# Patient Record
Sex: Female | Born: 1940 | Race: White | Hispanic: No | Smoking: Current every day smoker
Health system: Southern US, Community
[De-identification: ages and names within clinical notes are randomized; demographics above are authoritative.]

## PROBLEM LIST (undated history)

## (undated) DIAGNOSIS — F419 Anxiety disorder, unspecified: Secondary | ICD-10-CM

## (undated) DIAGNOSIS — F32A Depression, unspecified: Secondary | ICD-10-CM

## (undated) DIAGNOSIS — E119 Type 2 diabetes mellitus without complications: Secondary | ICD-10-CM

## (undated) DIAGNOSIS — K635 Polyp of colon: Secondary | ICD-10-CM

## (undated) DIAGNOSIS — F329 Major depressive disorder, single episode, unspecified: Secondary | ICD-10-CM

## (undated) DIAGNOSIS — L900 Lichen sclerosus et atrophicus: Secondary | ICD-10-CM

## (undated) HISTORY — DX: Type 2 diabetes mellitus without complications: E11.9

## (undated) HISTORY — DX: Lichen sclerosus et atrophicus: L90.0

## (undated) HISTORY — DX: Polyp of colon: K63.5

## (undated) HISTORY — DX: Anxiety disorder, unspecified: F41.9

## (undated) HISTORY — DX: Depression, unspecified: F32.A

---
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
## 1998-07-15 ENCOUNTER — Other Ambulatory Visit: Admission: RE | Admit: 1998-07-15 | Discharge: 1998-07-15 | Payer: Self-pay | Admitting: Obstetrics and Gynecology

## 1999-07-21 ENCOUNTER — Other Ambulatory Visit: Admission: RE | Admit: 1999-07-21 | Discharge: 1999-07-21 | Payer: Self-pay | Admitting: Obstetrics and Gynecology

## 2000-09-15 ENCOUNTER — Other Ambulatory Visit: Admission: RE | Admit: 2000-09-15 | Discharge: 2000-09-15 | Payer: Self-pay | Admitting: Obstetrics and Gynecology

## 2000-12-05 ENCOUNTER — Other Ambulatory Visit: Admission: RE | Admit: 2000-12-05 | Discharge: 2000-12-05 | Payer: Self-pay | Admitting: Obstetrics and Gynecology

## 2001-03-07 ENCOUNTER — Encounter: Admission: RE | Admit: 2001-03-07 | Discharge: 2001-03-07 | Payer: Self-pay | Admitting: Internal Medicine

## 2001-03-07 ENCOUNTER — Encounter: Payer: Self-pay | Admitting: Internal Medicine

## 2003-09-16 ENCOUNTER — Other Ambulatory Visit: Admission: RE | Admit: 2003-09-16 | Discharge: 2003-09-16 | Payer: Self-pay | Admitting: Obstetrics and Gynecology

## 2004-07-16 ENCOUNTER — Encounter: Admission: RE | Admit: 2004-07-16 | Discharge: 2004-07-16 | Payer: Self-pay | Admitting: Internal Medicine

## 2017-07-09 ENCOUNTER — Encounter: Payer: Self-pay | Admitting: Emergency Medicine

## 2017-07-09 ENCOUNTER — Emergency Department
Admission: EM | Admit: 2017-07-09 | Discharge: 2017-07-09 | Disposition: A | Discharge status: Home / Self Care | Payer: Medicare HMO | Source: Home / Self Care | Attending: Family Medicine | Admitting: Family Medicine

## 2017-07-09 ENCOUNTER — Other Ambulatory Visit: Payer: Self-pay

## 2017-07-09 DIAGNOSIS — N3090 Cystitis, unspecified without hematuria: Secondary | ICD-10-CM | POA: Diagnosis not present

## 2017-07-09 LAB — POCT URINALYSIS DIP (MANUAL ENTRY)
BILIRUBIN UA: NEGATIVE
BILIRUBIN UA: NEGATIVE mg/dL
Glucose, UA: 100 mg/dL — AB
Nitrite, UA: POSITIVE — AB
PH UA: 6 (ref 5.0–8.0)
SPEC GRAV UA: 1.01 (ref 1.010–1.025)
Urobilinogen, UA: 0.2 E.U./dL

## 2017-07-09 MED ORDER — NITROFURANTOIN MONOHYD MACRO 100 MG PO CAPS: 100.0000 mg | ORAL_CAPSULE | Freq: Two times a day (BID) | ORAL | 0 refills | Status: DC

## 2017-07-09 NOTE — Discharge Instructions (Signed)
Increase fluid intake. May use non-prescription AZO for about two days, if desired, to decrease urinary discomfort.  If symptoms become significantly worse during the night or over the weekend, proceed to the local emergency room.  

## 2017-07-09 NOTE — ED Provider Notes (Signed)
[facility]    CSN: [id number] Arrival date & time: 07/09/17  1524     History   Chief Complaint Chief Complaint  Patient presents with  . Dysuria    HPI Tonya Ashley is a 77 y.o. female.   Patient developed dysuria, nocturia, and frequency last night.  She noticed a small amount of blood in her urine, and has had chills.  She states that she was treated for bronchitis one week ago with a z-pack.   The history is provided by the patient.  Dysuria  Pain severity:  Mild Onset quality:  Sudden Duration:  1 day Timing:  Constant Progression:  Worsening Chronicity:  New Recent urinary tract infections: no   Relieved by:  Phenazopyridine Worsened by:  Nothing Ineffective treatments:  None tried Urinary symptoms: frequent urination, hematuria and hesitancy   Urinary symptoms: no discolored urine, no foul-smelling urine and no bladder incontinence   Associated symptoms: no abdominal pain, no fever, no flank pain, no genital lesions, no nausea, no vaginal discharge and no vomiting     History reviewed. No pertinent past medical history.  There are no active problems to display for this patient.   History reviewed. No pertinent surgical history.  OB History    No data available       Home Medications    Prior to Admission medications   Medication Sig Start Date End Date Taking? Authorizing Provider  clobetasol cream (TEMOVATE) 0.05 % Apply 1 application topically 2 (two) times daily.   Yes [provider]  cyclobenzaprine (FLEXERIL) 10 MG tablet Take 10 mg by mouth 3 (three) times daily as needed for muscle spasms.   Yes [provider]  DOXEPIN HCL PO Take by mouth.   Yes [provider]  fluticasone (FLONASE) 50 MCG/ACT nasal spray Place into both nostrils daily.   Yes [provider]  hydrocortisone 2.5 % lotion Apply topically 2 (two) times daily.   Yes [provider]  LORazepam (ATIVAN) 0.5 MG tablet  Take 0.5 mg by mouth every 8 (eight) hours.   Yes [provider]  metoprolol tartrate (LOPRESSOR) 50 MG tablet Take 50 mg by mouth 2 (two) times daily.   Yes [provider]  nitrofurantoin, macrocrystal-monohydrate, (MACROBID) 100 MG capsule Take 1 capsule (100 mg total) by mouth 2 (two) times daily. Take with food. 07/09/17   Lattie HawBeese, Mariadelosang Wynns A, MD    Family History History reviewed. No pertinent family history.  Social History Social History   Tobacco Use  . Smoking status: Current Every Day Smoker  . Smokeless tobacco: Current User  Substance Use Topics  . Alcohol use: No    Frequency: Never  . Drug use: No     Allergies   Diamox [acetazolamide]; Eryc [erythromycin]; Topamax [topiramate]; and Prednisone   Review of Systems Review of Systems  Constitutional: Negative for fever.  Gastrointestinal: Negative for abdominal pain, nausea and vomiting.  Genitourinary: Positive for dysuria. Negative for flank pain and vaginal discharge.  All other systems reviewed and are negative.   Physical Exam Triage Vital Signs ED Triage Vitals  Enc Vitals Group     BP 07/09/17 1609 (!) 143/82     Pulse Rate 07/09/17 1609 76     Resp 07/09/17 1609 16     Temp 07/09/17 1609 (!) 97.5 F (36.4 C)     Temp Source 07/09/17 1609 Oral     SpO2 07/09/17 1609 92 %     Weight 07/09/17  1609 200 lb (90.7 kg)     Height 07/09/17 1609 5' 7.5" (1.715 m)     Head Circumference --      Peak Flow --      Pain Score 07/09/17 1610 7     Pain Loc --      Pain Edu? --      Excl. in [place]? --    No data found.  Updated Vital Signs BP (!) 143/82 (BP Location: Left Arm)   Pulse 76   Temp (!) 97.5 F (36.4 C) (Oral)   Resp 16   Ht 5' 7.5" (1.715 m)   Wt 200 lb (90.7 kg)   SpO2 92%   BMI 30.86 kg/m   Visual Acuity Right Eye Distance:   Left Eye Distance:   Bilateral Distance:    Right Eye Near:   Left Eye Near:    Bilateral Near:     Physical Exam Nursing notes and Vital  Signs reviewed. Appearance:  Patient appears stated age, and in no acute distress.    Eyes:  Pupils are equal, round, and reactive to light and accomodation.  Extraocular movement is intact.  Conjunctivae are not inflamed   Pharynx:  Normal; moist mucous membranes  Neck:  Supple.  No adenopathy Lungs:  Clear to auscultation.  Breath sounds are equal.  Moving air well. Heart:  Regular rate and rhythm without murmurs, rubs, or gallops.  Abdomen:  Nontender without masses or hepatosplenomegaly.  Bowel sounds are present.  No CVA or flank tenderness.  Extremities:  No edema.  Skin:  No rash present.     UC Treatments / Results  Labs (all labs ordered are listed, but only abnormal results are displayed) Labs Reviewed  POCT URINALYSIS DIP (MANUAL ENTRY) - Abnormal; Notable for the following components:      Result Value   Color, UA orange (*)    Glucose, UA =100 (*)    Blood, UA large (*)    Protein Ur, POC trace (*)    Nitrite, UA Positive (*)    Leukocytes, UA Large (3+) (*)    All other components within normal limits  URINE CULTURE    EKG  EKG Interpretation None       Radiology No results found.  Procedures Procedures (including critical care time)  Medications Ordered in UC Medications - No data to display   Initial Impression / Assessment and Plan / UC Course  I have reviewed the triage vital signs and the nursing notes.  Pertinent labs & imaging results that were available during my care of the patient were reviewed by me and considered in my medical decision making (see chart for details).    Urine culture pending. Begin Macrobid 100mg  BID for one week. Increase fluid intake. May use non-prescription AZO for about two days, if desired, to decrease urinary discomfort.  If symptoms become significantly worse during the night or over the weekend, proceed to the local emergency room.  Followup with Family Doctor if not improved in 5 to 7 days.    Final  Clinical Impressions(s) / UC Diagnoses   Final diagnoses:  Cystitis    ED Discharge Orders        Ordered    nitrofurantoin, macrocrystal-monohydrate, (MACROBID) 100 MG capsule  2 times daily     07/09/17 1633          Lattie Haw, MD 07/15/17 [phone]

## 2017-07-09 NOTE — ED Triage Notes (Signed)
[facility] a [facility] Dysuria with frequency she states that she is going to the bathroom every 2 hours

## 2017-07-11 ENCOUNTER — Telehealth: Payer: Self-pay | Admitting: *Deleted

## 2017-07-11 LAB — URINE CULTURE
MICRO NUMBER:: 90021199
SPECIMEN QUALITY: ADEQUATE

## 2017-07-11 NOTE — Telephone Encounter (Signed)
Spoke to pt given Ucx results. complete ABT and call back if she has any questions or concerns.

## 2017-07-11 NOTE — Telephone Encounter (Signed)
LM to call back for Ucx results. Shanan Fitzpatrick, LPN  

## 2019-10-14 ENCOUNTER — Other Ambulatory Visit: Payer: Self-pay

## 2019-10-14 ENCOUNTER — Emergency Department (INDEPENDENT_AMBULATORY_CARE_PROVIDER_SITE_OTHER)
Admission: EM | Admit: 2019-10-14 | Discharge: 2019-10-14 | Disposition: A | Discharge status: Home / Self Care | Payer: Medicare HMO | Source: Home / Self Care

## 2019-10-14 ENCOUNTER — Emergency Department (INDEPENDENT_AMBULATORY_CARE_PROVIDER_SITE_OTHER): Payer: Medicare HMO

## 2019-10-14 DIAGNOSIS — J209 Acute bronchitis, unspecified: Secondary | ICD-10-CM

## 2019-10-14 DIAGNOSIS — J019 Acute sinusitis, unspecified: Secondary | ICD-10-CM | POA: Diagnosis not present

## 2019-10-14 DIAGNOSIS — M5441 Lumbago with sciatica, right side: Secondary | ICD-10-CM

## 2019-10-14 DIAGNOSIS — R062 Wheezing: Secondary | ICD-10-CM

## 2019-10-14 MED ORDER — ALBUTEROL SULFATE HFA 108 (90 BASE) MCG/ACT IN AERS: 1.0000 | INHALATION_SPRAY | Freq: Four times a day (QID) | RESPIRATORY_TRACT | 0 refills | Status: AC | PRN

## 2019-10-14 MED ORDER — DEXAMETHASONE SODIUM PHOSPHATE 10 MG/ML IJ SOLN
10.0000 mg | Freq: Once | INTRAMUSCULAR | Status: AC
  Administered 2019-10-14: 10 mg via INTRAMUSCULAR

## 2019-10-14 MED ORDER — DOXYCYCLINE HYCLATE 100 MG PO CAPS: 100.0000 mg | ORAL_CAPSULE | Freq: Two times a day (BID) | ORAL | 0 refills | Status: DC

## 2019-10-14 MED ORDER — HYDROCODONE-ACETAMINOPHEN 5-325 MG PO TABS: 1.0000 | ORAL_TABLET | Freq: Four times a day (QID) | ORAL | 0 refills | Status: AC | PRN

## 2019-10-14 NOTE — ED Triage Notes (Signed)
Patient presents to Urgent Care with complaints of productive cough since the past few days. Patient reports earlier today, she coughed up a brown-colored mucous. Pt also has sharp pains from her hip down the back of her leg to her ankle.

## 2019-10-14 NOTE — ED Provider Notes (Signed)
[facility]    CSN: [id number] Arrival date & time: 10/14/19  1306      History   Chief Complaint Chief Complaint  Patient presents with  . Cough    HPI Tonya Ashley is a 79 y.o. female.   HPI Tonya Ashley is a 79 y.o. female presenting to UC with c/o productive cough for about 5-6 days.  Mild fatigue and chest soreness from cough.  Today she coughed up thick brown-green mucous.  She has had nasal congestion with post-nasal drip.  She used an old albuterol inhaler the last few days without relief but she thinks it has expired. No hx of asthma or COPD but needs an inhaler on occasion when sick. Denies fever, chills, n/v/d. No sick contacts or recent travel.  Pt also c/o several weeks of Right lower back/buttock pain that radiates down her Right leg on occasion. Pain is sharp at times. She has an appointment with her PCP next month for same but is wondering if she can try some pain medication in the meantime. No known injury. No prior hx of sciatica. No change in bowel or bladder habits.    History reviewed. No pertinent past medical history.  There are no problems to display for this patient.   History reviewed. No pertinent surgical history.  OB History   No obstetric history on file.      Home Medications    Prior to Admission medications   Medication Sig Start Date End Date Taking? Authorizing Provider  metoprolol tartrate (LOPRESSOR) 50 MG tablet Take 50 mg by mouth 2 (two) times daily.   Yes [provider]  albuterol (VENTOLIN HFA) 108 (90 Base) MCG/ACT inhaler Inhale 1-2 puffs into the lungs every 6 (six) hours as needed for wheezing or shortness of breath. 10/14/19   Lurene Shadow, PA-C  clobetasol cream (TEMOVATE) 0.05 % Apply 1 application topically 2 (two) times daily.    [provider]  cyclobenzaprine (FLEXERIL) 10 MG tablet Take 10 mg by mouth 3 (three) times daily as needed for muscle spasms.    [provider]  DOXEPIN HCL PO Take by mouth.    [provider]  doxycycline (VIBRAMYCIN) 100 MG capsule Take 1 capsule (100 mg total) by mouth 2 (two) times daily. One po bid x 7 days 10/14/19   Lurene Shadow, PA-C  fluticasone [facility]) 50 MCG/ACT nasal spray Place into both nostrils daily.    [provider]  HYDROcodone-acetaminophen (NORCO/VICODIN) 5-325 MG tablet Take 1 tablet by mouth every 6 (six) hours as needed for moderate pain or severe pain. 10/14/19   Lurene Shadow, PA-C  hydrocortisone 2.5 % lotion Apply topically 2 (two) times daily.    [provider]  LORazepam (ATIVAN) 0.5 MG tablet Take 0.5 mg by mouth every 8 (eight) hours.    [provider]  nitrofurantoin, macrocrystal-monohydrate, (MACROBID) 100 MG capsule Take 1 capsule (100 mg total) by mouth 2 (two) times daily. Take with food. 07/09/17   Lattie Haw, MD    Family History Family History  Problem Relation Age of Onset  . Healthy Mother   . Osteoarthritis Mother   . Hypertension Father   . Cancer Father     Social History Social History   Tobacco Use  . Smoking status: Current Every Day Smoker    Packs/day: 0.75    Types: Cigarettes  . Smokeless tobacco: Current User  Substance Use Topics  . Alcohol use:  No  . Drug use: No     Allergies   Diamox [acetazolamide], Eryc [erythromycin], Topamax [topiramate], and Prednisone   Review of Systems Review of Systems  Constitutional: Positive for fatigue. Negative for chills and fever.  HENT: Positive for congestion. Negative for ear pain, sore throat, trouble swallowing and voice change.   Respiratory: Positive for cough, chest tightness, shortness of breath and wheezing.   Cardiovascular: Negative for chest pain and palpitations.  Gastrointestinal: Negative for abdominal pain, diarrhea, nausea and vomiting.  Musculoskeletal: Positive for back pain and myalgias. Negative for arthralgias and gait problem.  Skin: Negative for  rash.  Neurological: Negative for weakness and numbness.  All other systems reviewed and are negative.    Physical Exam Triage Vital Signs ED Triage Vitals  Enc Vitals Group     BP 10/14/19 1321 133/85     Pulse Rate 10/14/19 1321 70     Resp 10/14/19 1321 16     Temp 10/14/19 1321 98.1 F (36.7 C)     Temp Source 10/14/19 1321 Oral     SpO2 10/14/19 1321 98 %     Weight --      Height --      Head Circumference --      Peak Flow --      Pain Score 10/14/19 1318 0     Pain Loc --      Pain Edu? --      Excl. in [place]? --    No data found.  Updated Vital Signs BP 133/85 (BP Location: Right Arm)   Pulse 70   Temp 98.1 F (36.7 C) (Oral)   Resp 16   SpO2 98%   Visual Acuity Right Eye Distance:   Left Eye Distance:   Bilateral Distance:    Right Eye Near:   Left Eye Near:    Bilateral Near:     Physical Exam Vitals and nursing note reviewed.  Constitutional:      General: She is not in acute distress.    Appearance: Normal appearance. She is well-developed. She is not ill-appearing, toxic-appearing or diaphoretic.  HENT:     Head: Normocephalic and atraumatic.     Right Ear: Tympanic membrane and ear canal normal.     Left Ear: Tympanic membrane and ear canal normal.     Nose: Mucosal edema present.     Right Sinus: Maxillary sinus tenderness and frontal sinus tenderness present.     Left Sinus: Maxillary sinus tenderness and frontal sinus tenderness present.     Mouth/Throat:     Lips: Pink.     Mouth: Mucous membranes are moist.     Pharynx: Oropharynx is clear. Uvula midline.  Cardiovascular:     Rate and Rhythm: Normal rate and regular rhythm.  Pulmonary:     Effort: Pulmonary effort is normal. No respiratory distress.     Breath sounds: No stridor. Wheezing and rhonchi present.  Musculoskeletal:        General: Tenderness present. Normal range of motion.     Cervical back: Normal range of motion.     Comments: No spinal tenderness, full ROM upper and  lower extremities. Tenderness to Right buttock and right posterior and lateral thigh.  Calf is soft, non-tender.  Skin:    General: Skin is warm and dry.  Neurological:     Mental Status: She is alert and oriented to person, place, and time.  Psychiatric:        Behavior: Behavior normal.  UC Treatments / Results  Labs (all labs ordered are listed, but only abnormal results are displayed) Labs Reviewed - No data to display  EKG   Radiology DG Chest 2 View  Result Date: 10/14/2019 CLINICAL DATA:  Productive cough with fever for 6 days. EXAM: CHEST - 2 VIEW COMPARISON:  None. FINDINGS: The heart is mildly enlarged. There is mild aortic atherosclerosis. Central airway and mild interstitial thickening is present without edema, confluent airspace opacity, pleural effusion or pneumothorax. Mild degenerative changes in the thoracic spine. IMPRESSION: Mild cardiomegaly and mild central airway and interstitial thickening. No evidence of pneumonia. Electronically Signed   By: Carey Bullocks M.D.   On: 10/14/2019 13:58    Procedures Procedures (including critical care time)  Medications Ordered in UC Medications  dexamethasone (DECADRON) injection 10 mg (10 mg Intramuscular Given 10/14/19 1429)    Initial Impression / Assessment and Plan / UC Course  I have reviewed the triage vital signs and the nursing notes.  Pertinent labs & imaging results that were available during my care of the patient were reviewed by me and considered in my medical decision making (see chart for details).     Reviewed imaging with pt Decadron 10mg  IM given in UC, pt has done well with steroids in the past for cough but oral prednisone makes her jittery.  Reviewed imaging with pt Will start pt on antibiotics for sinusitis  Exam also c/w right side sciatica w/o red flag symptoms. Will tx pain, encouraged to keep f/u appointment with her PCP   AVS provided  Final Clinical Impressions(s) / UC  Diagnoses   Final diagnoses:  Acute bronchitis, unspecified organism  Acute rhinosinusitis  Wheeze  Acute right-sided low back pain with right-sided sciatica     Discharge Instructions      Norco/Vicodin (hydrocodone-acetaminophen) is a narcotic pain medication, do not combine these medications with others containing tylenol. While taking, do not drink alcohol, drive, or perform any other activities that requires focus while taking these medications.   Please take antibiotics as prescribed and be sure to complete entire course even if you start to feel better to ensure infection does not come back.  Please follow up with your family doctor later this week if not improving.  Call 911 or go to the hospital if symptoms worsening- difficulty breathing, chest pain, dizziness, passing out, or other new concerning symptoms develop.    ED Prescriptions    Medication Sig Dispense Auth. Provider   doxycycline (VIBRAMYCIN) 100 MG capsule Take 1 capsule (100 mg total) by mouth 2 (two) times daily. One po bid x 7 days 14 capsule Lidiya Reise O, PA-C   albuterol (VENTOLIN HFA) 108 (90 Base) MCG/ACT inhaler Inhale 1-2 puffs into the lungs every 6 (six) hours as needed for wheezing or shortness of breath. 8 g [birth year]-1987, PA-C   HYDROcodone-acetaminophen (NORCO/VICODIN) 5-325 MG tablet Take 1 tablet by mouth every 6 (six) hours as needed for moderate pain or severe pain. 6 tablet Lurene Shadow, Lurene Shadow     I have reviewed the PDMP during this encounter.   [place], PA-C 10/15/19 1058

## 2019-10-14 NOTE — Discharge Instructions (Signed)
  Norco/Vicodin (hydrocodone-acetaminophen) is a narcotic pain medication, do not combine these medications with others containing tylenol. While taking, do not drink alcohol, drive, or perform any other activities that requires focus while taking these medications.   Please take antibiotics as prescribed and be sure to complete entire course even if you start to feel better to ensure infection does not come back.  Please follow up with your family doctor later this week if not improving.  Call 911 or go to the hospital if symptoms worsening- difficulty breathing, chest pain, dizziness, passing out, or other new concerning symptoms develop.

## 2019-10-17 ENCOUNTER — Other Ambulatory Visit: Payer: Self-pay

## 2019-10-17 ENCOUNTER — Telehealth: Payer: Self-pay | Admitting: Emergency Medicine

## 2019-10-17 ENCOUNTER — Emergency Department
Admission: EM | Admit: 2019-10-17 | Discharge: 2019-10-17 | Discharge status: Absent without leave | Payer: Medicare HMO | Source: Home / Self Care

## 2019-10-17 MED ORDER — CEFDINIR 300 MG PO CAPS: ORAL_CAPSULE | ORAL | 0 refills | Status: AC

## 2019-10-17 NOTE — Telephone Encounter (Signed)
Subjective:  Patient complains of GI upset with doxycycline. PLAN: Begin Omnicef 300mg  PO Q12hr.

## 2019-10-17 NOTE — Telephone Encounter (Signed)
Patient called, says Doxycyline is making her feel like she swallowed a knife. Can we change her ABT? Thanks

## 2019-10-22 ENCOUNTER — Other Ambulatory Visit: Payer: Self-pay

## 2019-10-22 ENCOUNTER — Encounter: Payer: Self-pay | Admitting: Medical-Surgical

## 2019-10-22 ENCOUNTER — Ambulatory Visit (INDEPENDENT_AMBULATORY_CARE_PROVIDER_SITE_OTHER): Payer: Medicare HMO | Admitting: Medical-Surgical

## 2019-10-22 DIAGNOSIS — Z5329 Procedure and treatment not carried out because of patient's decision for other reasons: Secondary | ICD-10-CM

## 2021-11-22 IMAGING — DX DG CHEST 2V
2 series · 2 of 2 positions shown · non-contrast
Comparison: None.

CLINICAL DATA: Productive cough with fever for 6 days.

EXAM:
CHEST - 2 VIEW

[chest pa]
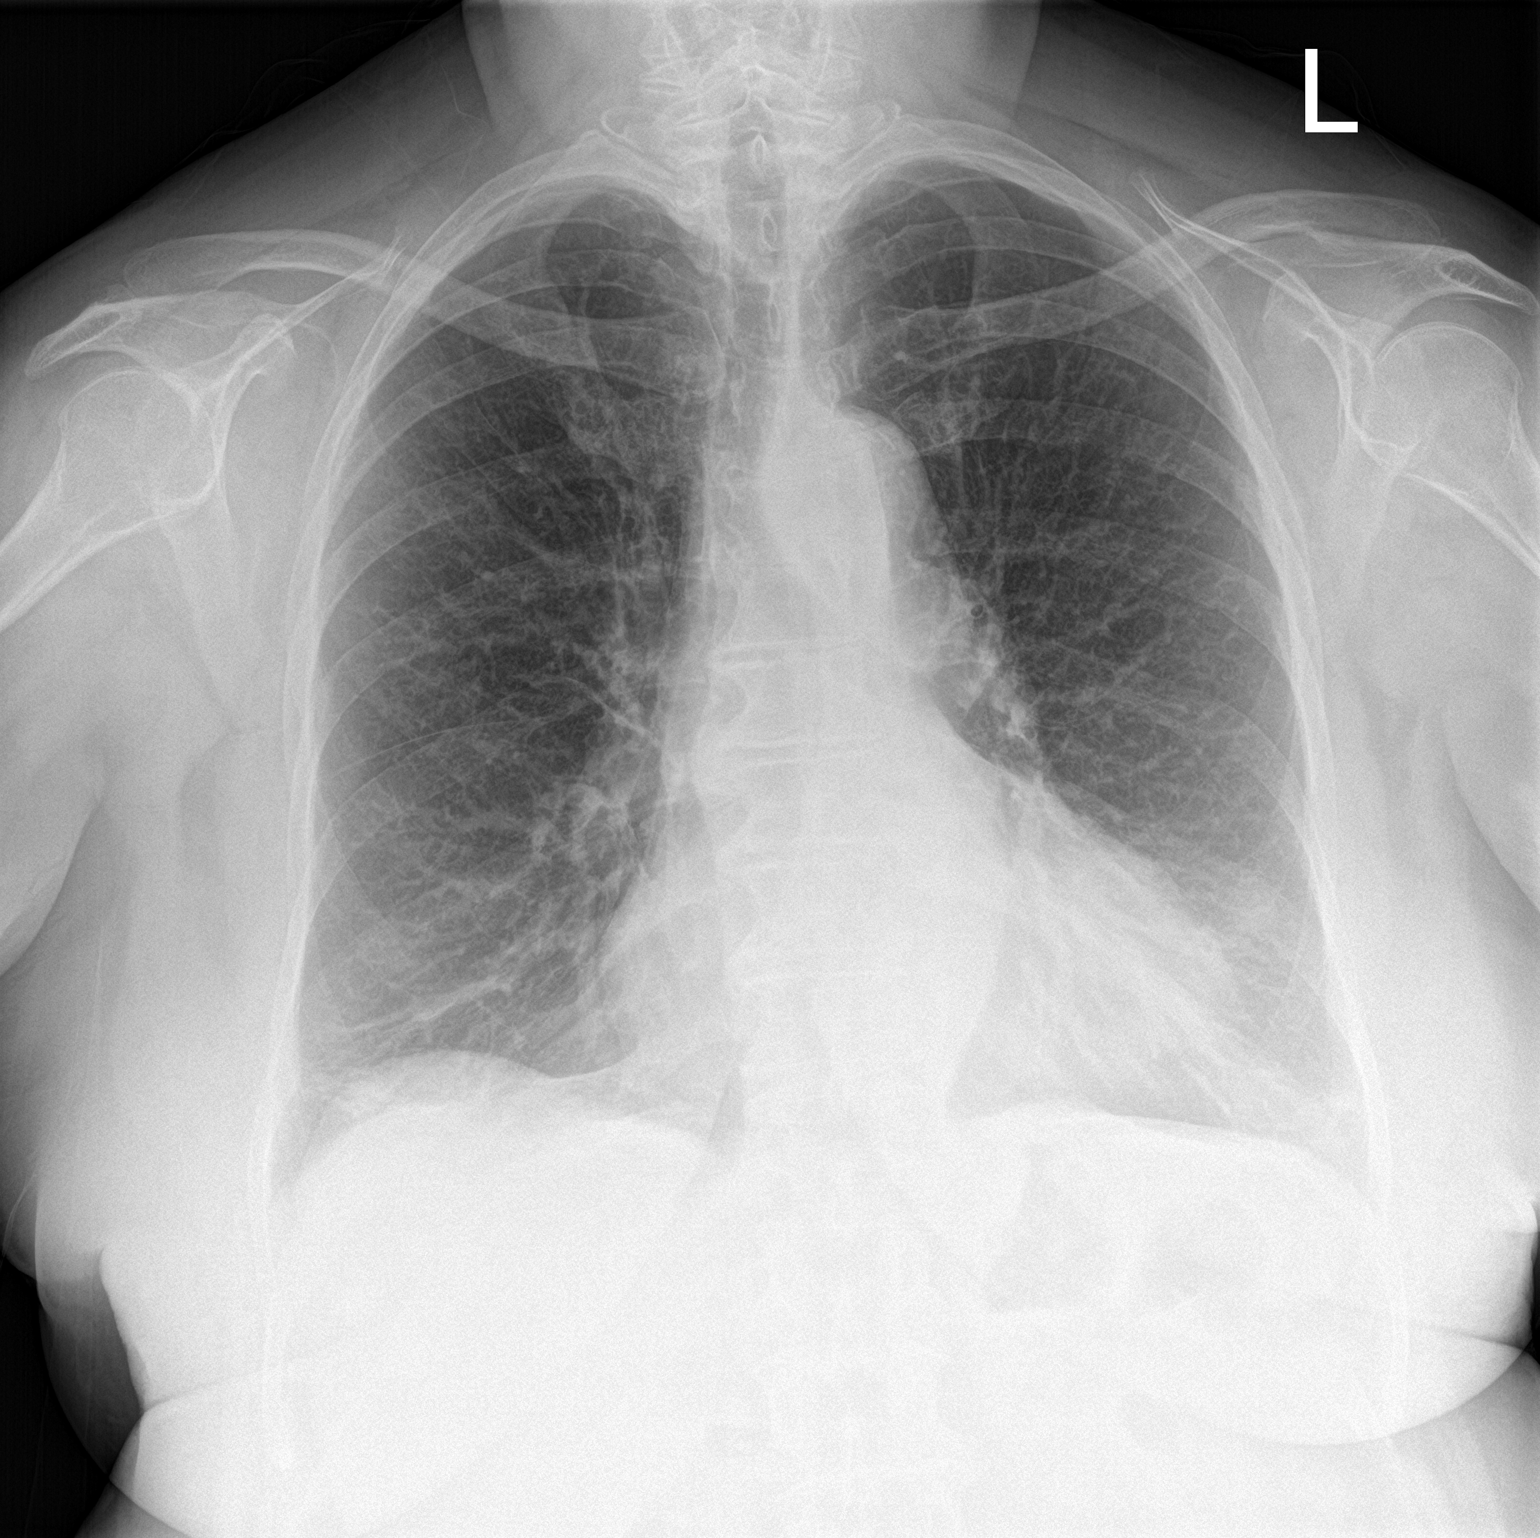

[chest lat]
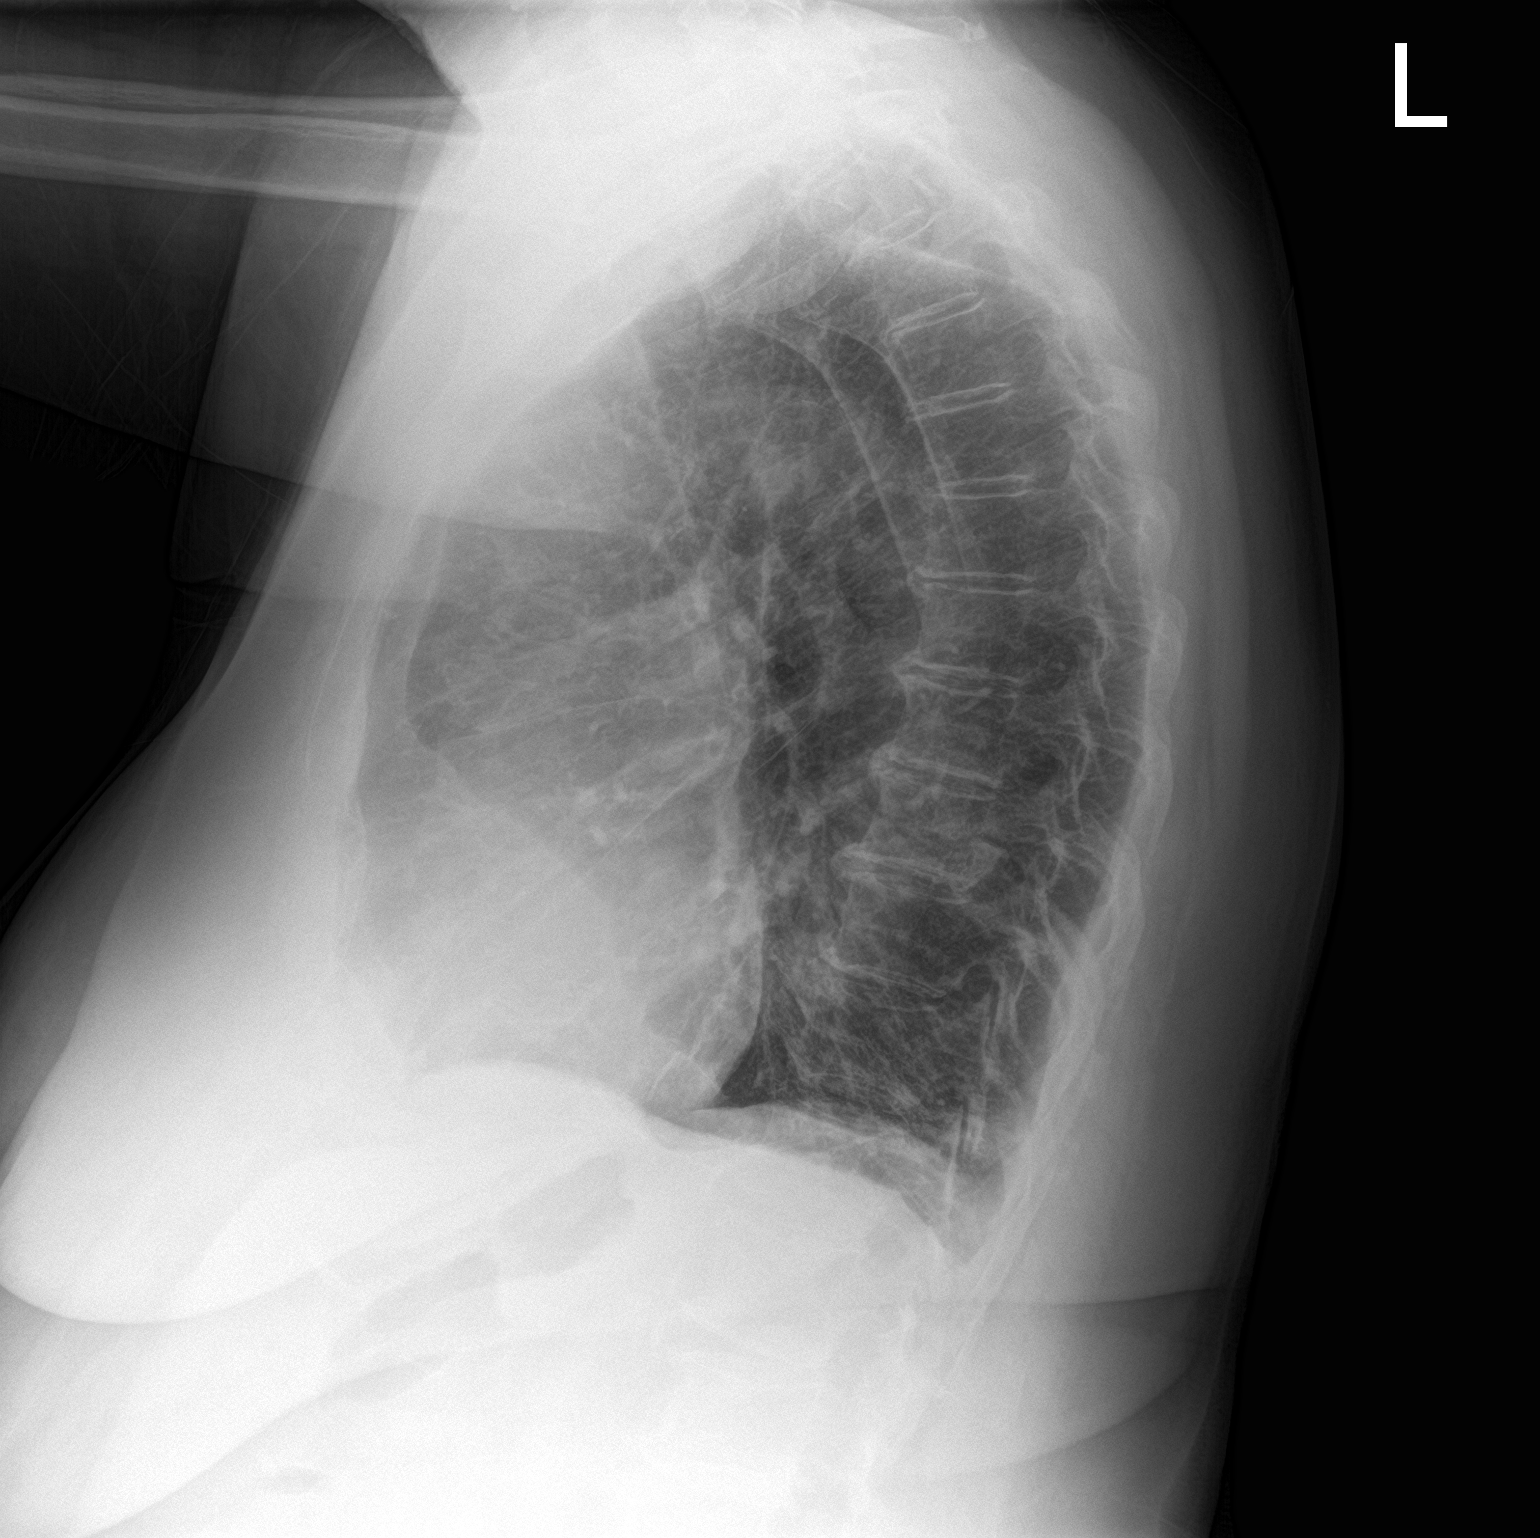

[2 of 2 positions shown; findings below may reference images not displayed]

FINDINGS: The heart is mildly enlarged. There is mild aortic atherosclerosis.
Central airway and mild interstitial thickening is present without
edema, confluent airspace opacity, pleural effusion or pneumothorax.
Mild degenerative changes in the thoracic spine.
IMPRESSION: Mild cardiomegaly and mild central airway and interstitial
thickening. No evidence of pneumonia.

## 2022-09-02 DEATH — deceased
